# Patient Record
Sex: Female | Born: 1995
Health system: Southern US, Community
[De-identification: ages and names within clinical notes are randomized; demographics above are authoritative.]

## PROBLEM LIST (undated history)

## (undated) DIAGNOSIS — A6000 Herpesviral infection of urogenital system, unspecified: Secondary | ICD-10-CM

## (undated) DIAGNOSIS — G8929 Other chronic pain: Secondary | ICD-10-CM

---
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
## 2014-01-10 ENCOUNTER — Encounter (HOSPITAL_BASED_OUTPATIENT_CLINIC_OR_DEPARTMENT_OTHER): Payer: Self-pay | Admitting: Emergency Medicine

## 2014-01-10 ENCOUNTER — Emergency Department (HOSPITAL_BASED_OUTPATIENT_CLINIC_OR_DEPARTMENT_OTHER)
Admission: EM | Admit: 2014-01-10 | Discharge: 2014-01-10 | Disposition: A | Discharge status: Home / Self Care | Payer: Medicaid Other | Attending: Emergency Medicine | Admitting: Emergency Medicine

## 2014-01-10 DIAGNOSIS — IMO0002 Reserved for concepts with insufficient information to code with codable children: Secondary | ICD-10-CM | POA: Insufficient documentation

## 2014-01-10 DIAGNOSIS — N760 Acute vaginitis: Secondary | ICD-10-CM | POA: Insufficient documentation

## 2014-01-10 DIAGNOSIS — Z3202 Encounter for pregnancy test, result negative: Secondary | ICD-10-CM | POA: Insufficient documentation

## 2014-01-10 DIAGNOSIS — B9689 Other specified bacterial agents as the cause of diseases classified elsewhere: Secondary | ICD-10-CM | POA: Insufficient documentation

## 2014-01-10 DIAGNOSIS — Z79899 Other long term (current) drug therapy: Secondary | ICD-10-CM | POA: Insufficient documentation

## 2014-01-10 DIAGNOSIS — N949 Unspecified condition associated with female genital organs and menstrual cycle: Secondary | ICD-10-CM | POA: Insufficient documentation

## 2014-01-10 DIAGNOSIS — A499 Bacterial infection, unspecified: Secondary | ICD-10-CM | POA: Insufficient documentation

## 2014-01-10 DIAGNOSIS — Z880 Allergy status to penicillin: Secondary | ICD-10-CM | POA: Insufficient documentation

## 2014-01-10 DIAGNOSIS — A6000 Herpesviral infection of urogenital system, unspecified: Secondary | ICD-10-CM | POA: Insufficient documentation

## 2014-01-10 HISTORY — DX: Herpesviral infection of urogenital system, unspecified: A60.00

## 2014-01-10 LAB — URINALYSIS, ROUTINE W REFLEX MICROSCOPIC
Bilirubin Urine: NEGATIVE
Glucose, UA: NEGATIVE mg/dL
Hgb urine dipstick: NEGATIVE
Ketones, ur: 15 mg/dL — AB
Nitrite: NEGATIVE
Protein, ur: 30 mg/dL — AB
Specific Gravity, Urine: 1.029 (ref 1.005–1.030)
Urobilinogen, UA: 1 mg/dL (ref 0.0–1.0)
pH: 6 (ref 5.0–8.0)

## 2014-01-10 LAB — URINE MICROSCOPIC-ADD ON

## 2014-01-10 LAB — PREGNANCY, URINE: PREG TEST UR: NEGATIVE

## 2014-01-10 LAB — WET PREP, GENITAL
Trich, Wet Prep: NONE SEEN
Yeast Wet Prep HPF POC: NONE SEEN

## 2014-01-10 MED ORDER — HYDROCODONE-ACETAMINOPHEN 5-325 MG PO TABS: 1.0000 | ORAL_TABLET | ORAL | Status: DC | PRN

## 2014-01-10 MED ORDER — VALACYCLOVIR HCL 500 MG PO TABS: 500.0000 mg | ORAL_TABLET | Freq: Two times a day (BID) | ORAL | Status: AC

## 2014-01-10 MED ORDER — METRONIDAZOLE 500 MG PO TABS
500.0000 mg | ORAL_TABLET | Freq: Two times a day (BID) | ORAL | Status: DC
Start: 2014-01-10 — End: 2015-05-25

## 2014-01-10 NOTE — ED Provider Notes (Signed)
CSN: [id number]     Arrival date & time 01/10/14  1140 History   First MD Initiated Contact with Patient 01/10/14 1203     Chief Complaint  Patient presents with  . Vaginal Pain     (Consider location/radiation/quality/duration/timing/severity/associated sxs/prior Treatment) HPI Comments: Patient is a 18 year old female with a past medical history genital herpes who presents to the emergency department complaining of a herpes outbreak x2 days. Patient reports she has vaginal intercourse with a latex condom and believes that this caused the outbreak. States this feels similar to her prior outbreaks. She is experiencing vaginal pain that is severe in nature. Her last outbreak was a few months ago. She is sexually active with one partner and uses protection each time. She is on birth control pills. Denies vaginal discharge or bleeding. Denies abdominal pain, nausea, vomiting or fevers. No urinary symptoms.  Patient is a 18 y.o. female presenting with vaginal pain. The history is provided by the patient.  Vaginal Pain    Past Medical History  Diagnosis Date  . Genital herpes    History reviewed. No pertinent past surgical history. No family history on file. History  Substance Use Topics  . Smoking status: Never Smoker   . Smokeless tobacco: Not on file  . Alcohol Use: Yes   OB History   Grav Para Term Preterm Abortions TAB SAB Ect Mult Living                 Review of Systems  Genitourinary: Positive for vaginal pain.  All other systems reviewed and are negative.     Allergies  Amoxicillin; Penicillins; and Zithromax  Home Medications   Prior to Admission medications   Medication Sig Start Date End Date Taking? Authorizing Provider  cetirizine (ZYRTEC) 10 MG tablet Take 10 mg by mouth daily.    Historical Provider, MD  cetirizine-pseudoephedrine (ZYRTEC-D) 5-120 MG per tablet Take 1 tablet by mouth daily. 11/24/13   Elson Areas, PA-C  HYDROcodone-acetaminophen (NORCO)  5-325 MG per tablet Take 1 tablet by mouth every 6 (six) hours as needed. 07/24/13   April K Palumbo-Rasch, MD  HYDROcodone-acetaminophen (NORCO/VICODIN) 5-325 MG per tablet Take 1-2 tablets by mouth every 4 (four) hours as needed. 01/10/14   Trevor Mace, PA-C  levonorgestrel-ethinyl estradiol (SEASONALE,INTROVALE,JOLESSA) 0.15-0.03 MG tablet Take 1 tablet by mouth daily.    Historical Provider, MD  metroNIDAZOLE (FLAGYL) 500 MG tablet Take 1 tablet (500 mg total) by mouth 2 (two) times daily. One po bid x 7 days 01/10/14   Trevor Mace, PA-C  predniSONE (DELTASONE) 10 MG tablet 4,0,9,8,1,1 taper 11/24/13   Elson Areas, PA-C  triamcinolone (NASACORT AQ) 55 MCG/ACT AERO nasal inhaler Place 2 sprays into the nose daily. 03/28/13   Shari A Upstill, PA-C  valACYclovir (VALTREX) 500 MG tablet Take 1 tablet (500 mg total) by mouth 2 (two) times daily. 01/10/14 01/24/14  Trevor Mace, PA-C   BP 120/74  Pulse 75  Temp(Src) 98.9 F (37.2 C) (Oral)  Resp 20  Ht  (1.575 m)  Wt 135 lb (61.236 kg)  BMI 24.69 kg/m2  SpO2 100%  LMP 01/01/2014 Physical Exam  Nursing note and vitals reviewed. Constitutional: She is oriented to person, place, and time. She appears well-developed and well-nourished. No distress.  HENT:  Head: Normocephalic and atraumatic.  Mouth/Throat: Oropharynx is clear and moist.  Eyes: Conjunctivae are normal.  Neck: Normal range of motion. Neck supple.  Cardiovascular: Normal rate, regular rhythm and  normal heart sounds.   Pulmonary/Chest: Effort normal and breath sounds normal.  Abdominal: Soft. Bowel sounds are normal. There is no tenderness.  Genitourinary: Right adnexum displays no mass, no tenderness and no fullness. Left adnexum displays no mass, no tenderness and no fullness.  Herpetic appearing lesions medial to bilateral labia minora. Copious amount of thick, white, malodorous vaginal discharge. No cervical discharge or cervical motion tenderness.   Musculoskeletal: Normal range of motion. She exhibits no edema.  Neurological: She is alert and oriented to person, place, and time.  Skin: Skin is warm and dry. She is not diaphoretic.  Psychiatric: She has a normal mood and affect. Her behavior is normal.    ED Course  Procedures (including critical care time) Labs Review Labs Reviewed  WET PREP, GENITAL - Abnormal; Notable for the following:    Clue Cells Wet Prep HPF POC MANY (*)    WBC, Wet Prep HPF POC MODERATE (*)    All other components within normal limits  URINALYSIS, ROUTINE W REFLEX MICROSCOPIC - Abnormal; Notable for the following:    Color, Urine AMBER (*)    APPearance TURBID (*)    Ketones, ur 15 (*)    Protein, ur 30 (*)    Leukocytes, UA MODERATE (*)    All other components within normal limits  URINE MICROSCOPIC-ADD ON - Abnormal; Notable for the following:    Squamous Epithelial / LPF MANY (*)    Bacteria, UA MANY (*)    All other components within normal limits  GC/CHLAMYDIA PROBE AMP  PREGNANCY, URINE    Imaging Review No results found.   EKG Interpretation None      MDM   Final diagnoses:  BV (bacterial vaginosis)  Genital herpes   Patient was in the vaginal pain. She is nontoxic-appearing in no apparent distress. Afebrile, vital signs stable. Herpetic lesions noted on exam along with a large amount of vaginal discharge. No cervical motion tenderness or adnexal tenderness. Wet prep positive for many clue cells. GC/Chlamydia cultures pending. Patient is not concerned about treatment for STDs at this time. Will discharge with Flagyl, Valtrex and pain medication. Infection care precautions discussed. Return precautions given. Patient states understanding of treatment care plan and is agreeable.   Trevor Mace, PA-C 01/10/14 1321

## 2014-01-10 NOTE — ED Provider Notes (Signed)
Medical screening examination/treatment/procedure(s) were performed by non-physician practitioner and as supervising physician I was immediately available for consultation/collaboration.   EKG Interpretation None        Moussa Wiegand H Havah Ammon, MD 01/10/14 1857 

## 2014-01-10 NOTE — ED Notes (Signed)
Herpes outbreak. Vaginal pain x 2 days.

## 2014-01-10 NOTE — Discharge Instructions (Signed)
Take valtrex twice daily x 3 days. Take flagyl as prescribed. Take Vicodin for severe pain only. No driving or operating heavy machinery while taking vicodin. This medication may cause drowsiness.  Genital Herpes Genital herpes is a sexually transmitted disease. This means that it is a disease passed by having sex with an infected person. There is no cure for genital herpes. The time between attacks can be months to years. The virus may live in a person but produce no problems (symptoms). This infection can be passed to a baby as it travels down the birth canal (vagina). In a newborn, this can cause central nervous system damage, eye damage, or even death. The virus that causes genital herpes is usually HSV-2 virus. The virus that causes oral herpes is usually HSV-1. The diagnosis (learning what is wrong) is made through culture results. SYMPTOMS  Usually symptoms of pain and itching begin a few days to a week after contact. It first appears as small blisters that progress to small painful ulcers which then scab over and heal after several days. It affects the outer genitalia, birth canal, cervix, penis, anal area, buttocks, and thighs. HOME CARE INSTRUCTIONS   Keep ulcerated areas dry and clean.  Take medications as directed. Antiviral medications can speed up healing. They will not prevent recurrences or cure this infection. These medications can also be taken for suppression if there are frequent recurrences.  While the infection is active, it is contagious. Avoid all sexual contact during active infections.  Condoms may help prevent spread of the herpes virus.  Practice safe sex.  Wash your hands thoroughly after touching the genital area.  Avoid touching your eyes after touching your genital area.  Inform your caregiver if you have had genital herpes and become pregnant. It is your responsibility to insure a safe outcome for your baby in this pregnancy.  Only take over-the-counter or  prescription medicines for pain, discomfort, or fever as directed by your caregiver. SEEK MEDICAL CARE IF:   You have a recurrence of this infection.  You do not respond to medications and are not improving.  You have new sources of pain or discharge which have changed from the original infection.  You have an oral temperature above 102 F (38.9 C).  You develop abdominal pain.  You develop eye pain or signs of eye infection. Document Released: 04/26/2000 Document Revised: 07/22/2011 Document Reviewed: 05/17/2009 [facility] Patient Information 2015 [place], [place]. This information is not intended to replace advice given to you by your health care provider. Make sure you discuss any questions you have with your health care provider.  Bacterial Vaginosis Bacterial vaginosis is a vaginal infection that occurs when the normal balance of bacteria in the vagina is disrupted. It results from an overgrowth of certain bacteria. This is the most common vaginal infection in women of childbearing age. Treatment is important to prevent complications, especially in pregnant women, as it can cause a premature delivery. CAUSES  Bacterial vaginosis is caused by an increase in harmful bacteria that are normally present in smaller amounts in the vagina. Several different kinds of bacteria can cause bacterial vaginosis. However, the reason that the condition develops is not fully understood. RISK FACTORS Certain activities or behaviors can put you at an increased risk of developing bacterial vaginosis, including:  Having a new sex partner or multiple sex partners.  Douching.  Using an intrauterine device (IUD) for contraception. Women do not get bacterial vaginosis from toilet seats, bedding, swimming pools, or contact  with objects around them. SIGNS AND SYMPTOMS  Some women with bacterial vaginosis have no signs or symptoms. Common symptoms include:  Grey vaginal discharge.  A fishlike odor with  discharge, especially after sexual intercourse.  Itching or burning of the vagina and vulva.  Burning or pain with urination. DIAGNOSIS  Your health care provider will take a medical history and examine the vagina for signs of bacterial vaginosis. A sample of vaginal fluid may be taken. Your health care provider will look at this sample under a microscope to check for bacteria and abnormal cells. A vaginal pH test may also be done.  TREATMENT  Bacterial vaginosis may be treated with antibiotic medicines. These may be given in the form of a pill or a vaginal cream. A second round of antibiotics may be prescribed if the condition comes back after treatment.  HOME CARE INSTRUCTIONS   Only take over-the-counter or prescription medicines as directed by your health care provider.  If antibiotic medicine was prescribed, take it as directed. Make sure you finish it even if you start to feel better.  Do not have sex until treatment is completed.  Tell all sexual partners that you have a vaginal infection. They should see their health care provider and be treated if they have problems, such as a mild rash or itching.  Practice safe sex by using condoms and only having one sex partner. SEEK MEDICAL CARE IF:   Your symptoms are not improving after 3 days of treatment.  You have increased discharge or pain.  You have a fever. MAKE SURE YOU:   Understand these instructions.  Will watch your condition.  Will get help right away if you are not doing well or get worse. FOR MORE INFORMATION  Centers for Disease Control and Prevention, Division of STD Prevention: [employer] American Sexual Health Association (ASHA): www.ashastd.org  Document Released: 04/29/2005 Document Revised: 02/17/2013 Document Reviewed: 12/09/2012 [facility] Patient Information 2015 [place], [place]. This information is not intended to replace advice given to you by your health care provider. Make sure you discuss any  questions you have with your health care provider.

## 2014-01-11 LAB — GC/CHLAMYDIA PROBE AMP
CT Probe RNA: NEGATIVE
GC PROBE AMP APTIMA: NEGATIVE

## 2015-05-25 ENCOUNTER — Emergency Department (HOSPITAL_BASED_OUTPATIENT_CLINIC_OR_DEPARTMENT_OTHER): Payer: Medicaid Other

## 2015-05-25 ENCOUNTER — Encounter (HOSPITAL_BASED_OUTPATIENT_CLINIC_OR_DEPARTMENT_OTHER): Payer: Self-pay | Admitting: Emergency Medicine

## 2015-05-25 ENCOUNTER — Emergency Department (HOSPITAL_BASED_OUTPATIENT_CLINIC_OR_DEPARTMENT_OTHER)
Admission: EM | Admit: 2015-05-25 | Discharge: 2015-05-25 | Disposition: A | Discharge status: Home / Self Care | Payer: Medicaid Other | Attending: Emergency Medicine | Admitting: Emergency Medicine

## 2015-05-25 DIAGNOSIS — R109 Unspecified abdominal pain: Secondary | ICD-10-CM

## 2015-05-25 DIAGNOSIS — R197 Diarrhea, unspecified: Secondary | ICD-10-CM | POA: Diagnosis not present

## 2015-05-25 DIAGNOSIS — R630 Anorexia: Secondary | ICD-10-CM | POA: Insufficient documentation

## 2015-05-25 DIAGNOSIS — Z79899 Other long term (current) drug therapy: Secondary | ICD-10-CM | POA: Insufficient documentation

## 2015-05-25 DIAGNOSIS — Z3202 Encounter for pregnancy test, result negative: Secondary | ICD-10-CM | POA: Insufficient documentation

## 2015-05-25 DIAGNOSIS — R1031 Right lower quadrant pain: Secondary | ICD-10-CM | POA: Insufficient documentation

## 2015-05-25 DIAGNOSIS — Z880 Allergy status to penicillin: Secondary | ICD-10-CM | POA: Diagnosis not present

## 2015-05-25 DIAGNOSIS — R110 Nausea: Secondary | ICD-10-CM | POA: Insufficient documentation

## 2015-05-25 LAB — URINALYSIS, ROUTINE W REFLEX MICROSCOPIC
Bilirubin Urine: NEGATIVE
Glucose, UA: NEGATIVE mg/dL
Hgb urine dipstick: NEGATIVE
Ketones, ur: NEGATIVE mg/dL
NITRITE: NEGATIVE
PROTEIN: NEGATIVE mg/dL
SPECIFIC GRAVITY, URINE: 1.024 (ref 1.005–1.030)
pH: 6.5 (ref 5.0–8.0)

## 2015-05-25 LAB — CBC WITH DIFFERENTIAL/PLATELET
BASOS PCT: 0 %
Basophils Absolute: 0 10*3/uL (ref 0.0–0.1)
Eosinophils Absolute: 0.1 10*3/uL (ref 0.0–0.7)
Eosinophils Relative: 4 %
HEMATOCRIT: 38.2 % (ref 36.0–46.0)
Hemoglobin: 12.7 g/dL (ref 12.0–15.0)
LYMPHS PCT: 50 %
Lymphs Abs: 1.7 10*3/uL (ref 0.7–4.0)
MCH: 25.3 pg — ABNORMAL LOW (ref 26.0–34.0)
MCHC: 33.2 g/dL (ref 30.0–36.0)
MCV: 76.1 fL — AB (ref 78.0–100.0)
Monocytes Absolute: 0.3 10*3/uL (ref 0.1–1.0)
Monocytes Relative: 9 %
Neutro Abs: 1.3 10*3/uL — ABNORMAL LOW (ref 1.7–7.7)
Neutrophils Relative %: 37 %
Platelets: 371 10*3/uL (ref 150–400)
RBC: 5.02 MIL/uL (ref 3.87–5.11)
RDW: 14.1 % (ref 11.5–15.5)
WBC: 3.5 10*3/uL — AB (ref 4.0–10.5)

## 2015-05-25 LAB — URINE MICROSCOPIC-ADD ON

## 2015-05-25 LAB — BASIC METABOLIC PANEL
Anion gap: 8 (ref 5–15)
BUN: 11 mg/dL (ref 6–20)
CALCIUM: 9.4 mg/dL (ref 8.9–10.3)
CO2: 27 mmol/L (ref 22–32)
CREATININE: 0.82 mg/dL (ref 0.44–1.00)
Chloride: 106 mmol/L (ref 101–111)
GFR calc non Af Amer: >60 mL/min (ref >60)
GLUCOSE: 94 mg/dL (ref 65–99)
Potassium: 3.9 mmol/L (ref 3.5–5.1)
Sodium: 141 mmol/L (ref 135–145)

## 2015-05-25 LAB — PREGNANCY, URINE: Preg Test, Ur: NEGATIVE

## 2015-05-25 MED ORDER — ONDANSETRON HCL 4 MG/2ML IJ SOLN
4.0000 mg | Freq: Once | INTRAMUSCULAR | Status: AC
  Administered 2015-05-25: 4 mg via INTRAVENOUS
  Filled 2015-05-25: qty 2

## 2015-05-25 MED ORDER — IOHEXOL 300 MG/ML  SOLN
100.0000 mL | Freq: Once | INTRAMUSCULAR | Status: AC | PRN
  Administered 2015-05-25: 100 mL via INTRAVENOUS

## 2015-05-25 MED ORDER — KETOROLAC TROMETHAMINE 30 MG/ML IJ SOLN
30.0000 mg | Freq: Once | INTRAMUSCULAR | Status: AC
  Administered 2015-05-25: 30 mg via INTRAVENOUS
  Filled 2015-05-25: qty 1

## 2015-05-25 MED ORDER — NAPROXEN 500 MG PO TABS: 500.0000 mg | ORAL_TABLET | Freq: Two times a day (BID) | ORAL | Status: DC

## 2015-05-25 MED ORDER — IOHEXOL 300 MG/ML  SOLN
25.0000 mL | Freq: Once | INTRAMUSCULAR | Status: AC | PRN
  Administered 2015-05-25: 25 mL via ORAL

## 2015-05-25 NOTE — ED Notes (Signed)
Patient is alert and oriented x3.  She was given DC instructions and follow up visit instructions.  Patient gave verbal understanding. She was DC ambulatory under her own power to home.  V/S stable.  He was not showing any signs of distress on DC 

## 2015-05-25 NOTE — ED Provider Notes (Signed)
CSN: [id number]     Arrival date & time 05/25/15  0831 History   First MD Initiated Contact with Patient 05/25/15 [phone]     Chief Complaint  Patient presents with  . Abdominal Pain      HPI  Patient presents for evaluation of abdominal pain. Onset last night 11 PM.  She was in her normal state of health. She began feeling some cramping pain in her lower abdomen. Perhaps right greater than left but she does not recall. Her last missed her period was in December 17. She is a week early. States her symptoms do not resemble menstrual symptoms. Although I thought maybe that was last night". She takes Vicoprofen and went to bed. She is not having more diarrhea. She waking this morning at 6 AM. She was awakened by the pain. Hurts to move. It is no longer cramping is constant and more onto the right side. Nausea no vomiting. Does not have an appetite currently. No fevers or chills. No dysuria. No discharge. No vaginal bleeding.  Status post C-section September 2015.  Past Medical History  Diagnosis Date  . Genital herpes    Past Surgical History  Procedure Laterality Date  . Cesarean section     No family history on file. Social History  Substance Use Topics  . Smoking status: Never Smoker   . Smokeless tobacco: None  . Alcohol Use: Yes   OB History    No data available     Review of Systems  Constitutional: Positive for appetite change. Negative for fever, chills, diaphoresis and fatigue.  HENT: Negative for mouth sores, sore throat and trouble swallowing.   Eyes: Negative for visual disturbance.  Respiratory: Negative for cough, chest tightness, shortness of breath and wheezing.   Cardiovascular: Negative for chest pain.  Gastrointestinal: Positive for nausea, abdominal pain and diarrhea. Negative for vomiting and abdominal distention.  Endocrine: Negative for polydipsia, polyphagia and polyuria.  Genitourinary: Negative for dysuria, frequency and hematuria.  Musculoskeletal:  Negative for gait problem.  Skin: Negative for color change, pallor and rash.  Neurological: Negative for dizziness, syncope, light-headedness and headaches.  Hematological: Does not bruise/bleed easily.  Psychiatric/Behavioral: Negative for behavioral problems and confusion.      Allergies  Amoxicillin; Penicillins; and Zithromax  Home Medications   Prior to Admission medications   Medication Sig Start Date End Date Taking? Authorizing Provider  cetirizine (ZYRTEC) 10 MG tablet Take 10 mg by mouth daily.    Historical Provider, MD  naproxen (NAPROSYN) 500 MG tablet Take 1 tablet (500 mg total) by mouth 2 (two) times daily. 05/25/15   Rolland PorterMark Karen Huhta, MD   BP 116/77 mmHg  Pulse 68  Temp(Src) 98.2 F (36.8 C) (Oral)  Resp 16  Ht 5\' 2"  (1.575 m)  Wt 147 lb (66.679 kg)  BMI 26.88 kg/m2  SpO2 97%  LMP 04/29/2015 Physical Exam  Constitutional: She is oriented to person, place, and time. She appears well-developed and well-nourished. No distress.  HENT:  Head: Normocephalic.  Eyes: Conjunctivae are normal. Pupils are equal, round, and reactive to light. No scleral icterus.  Neck: Normal range of motion. Neck supple. No thyromegaly present.  Cardiovascular: Normal rate and regular rhythm.  Exam reveals no gallop and no friction rub.   No murmur heard. Pulmonary/Chest: Effort normal and breath sounds normal. No respiratory distress. She has no wheezes. She has no rales.  Abdominal: Soft. Bowel sounds are normal. She exhibits no distension. There is no tenderness. There is no rebound.  Slight tenderness in the right lower quadrant. Pain in the left abdomen inconsistently produces discomfort in the right abdomen. She describes discomfort with walking but has a normal gait. She describes discomfort with movement of the right lower extremity but has a negative psoas.  Musculoskeletal: Normal range of motion.  Neurological: She is alert and oriented to person, place, and time.  Skin: Skin  is warm and dry. No rash noted.  Psychiatric: She has a normal mood and affect. Her behavior is normal.    ED Course  Procedures (including critical care time) Labs Review Labs Reviewed  URINALYSIS, ROUTINE W REFLEX MICROSCOPIC (NOT AT [facility]) - Abnormal; Notable for the following:    APPearance CLOUDY (*)    Leukocytes, UA SMALL (*)    All other components within normal limits  CBC WITH DIFFERENTIAL/PLATELET - Abnormal; Notable for the following:    WBC 3.5 (*)    MCV 76.1 (*)    MCH 25.3 (*)    Neutro Abs 1.3 (*)    All other components within normal limits  URINE MICROSCOPIC-ADD ON - Abnormal; Notable for the following:    Squamous Epithelial / LPF 0-5 (*)    Bacteria, UA MANY (*)    All other components within normal limits  PREGNANCY, URINE  BASIC METABOLIC PANEL    Imaging Review Ct Abdomen Pelvis W Contrast  05/25/2015  CLINICAL DATA:  Right lower quadrant pain with nausea for 1 day EXAM: CT ABDOMEN AND PELVIS WITH CONTRAST TECHNIQUE: Multidetector CT imaging of the abdomen and pelvis was performed using the standard protocol following bolus administration of intravenous contrast. Oral contrast was also administered. CONTRAST:  OMNIPAQUE IOHEXOL 300 MG/ML SOLN, 25mL OMNIPAQUE IOHEXOL 300 MG/ML SOLN COMPARISON:  June 17, 2008 FINDINGS: Lower chest:  Lung bases are clear. Hepatobiliary: There is fatty infiltration near the fissure for the ligamentum teres. No focal liver lesions are identified. The gallbladder wall is not appreciably thickened. There is no biliary duct dilatation. Pancreas: No mass or inflammatory focus. Spleen: No splenic lesions are identified. Adrenals/Urinary Tract: Adrenals appear normal bilaterally. Kidneys bilaterally show no mass or hydronephrosis on either side. There is no renal or ureteral calculus on either side. Urinary bladder is midline with wall thickness within normal limits. Stomach/Bowel: There is no bowel wall or mesenteric thickening. No  bowel obstruction. No free air or portal venous air. Vascular/Lymphatic: There is no abdominal aortic aneurysm. Major mesenteric vessels appear patent. No adenopathy is appreciable in the abdomen or pelvis. Reproductive: Uterus is anteverted. There is an intrauterine device within the uterus. There is no pelvic mass. There is a small amount of free fluid in the dependent portion of the pelvis. Other: Appendix appears normal. No abscess in the abdomen or pelvis. Musculoskeletal: There are no blastic or lytic bone lesions. No intramuscular or abdominal wall lesions. IMPRESSION: Small amount of free fluid in the dependent portion the pelvis. This amount of fluid is upper physiologic versus sequela of recent ovarian cyst rupture. Appendix region appears normal. No bowel obstruction. No abscess. No renal or ureteral calculus. No hydronephrosis. Intrauterine device within the endometrium. Fatty infiltration near the fissure for the ligamentum teres. Electronically Signed   By: Bretta Bang III M.D.   On: 05/25/2015 10:25   I have personally reviewed and evaluated these images and lab results as part of my medical decision-making.   EKG Interpretation None      MDM   Final diagnoses:  Abdominal pain, unspecified abdominal location  He was improved with Toradol. Taking by mouth. CT scan shows trace fluid right pelvis. No adenopathy. No appendicitis is discussed these findings with her at length. We discussed pelvic exam. She inquired about the indications. We discussed sexual transmitted disease, ovarian cyst, PID. She politely declines. I think this is not unreasonable. Plan be treatment with anti-inflammatories. With him white blood cell count slightly low this may be viral. Ruptured ovarian cyst is in the differential diagnosis. Not sudden onset or severe pain to suggest ovarian torsion. Appropriate for discharge. Return precautions discussed.   Rolland Porter, MD 05/25/15 1115

## 2015-05-25 NOTE — ED Notes (Signed)
Drinking contrast.

## 2015-05-25 NOTE — ED Notes (Signed)
Onset of abd pain with diarrhea last pm   Pain worse this am

## 2015-05-25 NOTE — Discharge Instructions (Signed)
Return to ER with any new or worsening symptoms including vaginal discharge, worsening pain, fever, bloody stools, or other new or changing your current symptoms  Abdominal Pain, Adult Many things can cause abdominal pain. Usually, abdominal pain is not caused by a disease and will improve without treatment. It can often be observed and treated at home. Your health care provider will do a physical exam and possibly order blood tests and X-rays to help determine the seriousness of your pain. However, in many cases, more time must pass before a clear cause of the pain can be found. Before that point, your health care provider may not know if you need more testing or further treatment. HOME CARE INSTRUCTIONS Monitor your abdominal pain for any changes. The following actions may help to alleviate any discomfort you are experiencing:  Only take over-the-counter or prescription medicines as directed by your health care provider.  Do not take laxatives unless directed to do so by your health care provider.  Try a clear liquid diet (broth, tea, or water) as directed by your health care provider. Slowly move to a bland diet as tolerated. SEEK MEDICAL CARE IF:  You have unexplained abdominal pain.  You have abdominal pain associated with nausea or diarrhea.  You have pain when you urinate or have a bowel movement.  You experience abdominal pain that wakes you in the night.  You have abdominal pain that is worsened or improved by eating food.  You have abdominal pain that is worsened with eating fatty foods.  You have a fever. SEEK IMMEDIATE MEDICAL CARE IF:  Your pain does not go away within 2 hours.  You keep throwing up (vomiting).  Your pain is felt only in portions of the abdomen, such as the right side or the left lower portion of the abdomen.  You pass bloody or black tarry stools. MAKE SURE YOU:  Understand these instructions.  Will watch your condition.  Will get help right  away if you are not doing well or get worse.   This information is not intended to replace advice given to you by your health care provider. Make sure you discuss any questions you have with your health care provider.   Document Released: 02/06/2005 Document Revised: 01/18/2015 Document Reviewed: 01/06/2013 Elsevier Interactive Patient Education [employer]2016 Elsevier Inc.

## 2015-05-25 NOTE — ED Notes (Signed)
MD at bedside. 

## 2018-10-02 ENCOUNTER — Other Ambulatory Visit: Payer: Self-pay

## 2018-10-02 ENCOUNTER — Encounter (HOSPITAL_BASED_OUTPATIENT_CLINIC_OR_DEPARTMENT_OTHER): Payer: Self-pay | Admitting: Emergency Medicine

## 2018-10-02 ENCOUNTER — Emergency Department (HOSPITAL_BASED_OUTPATIENT_CLINIC_OR_DEPARTMENT_OTHER)
Admission: EM | Admit: 2018-10-02 | Discharge: 2018-10-02 | Disposition: A | Discharge status: Home / Self Care | Payer: Self-pay | Attending: Emergency Medicine | Admitting: Emergency Medicine

## 2018-10-02 DIAGNOSIS — T378X5A Adverse effect of other specified systemic anti-infectives and antiparasitics, initial encounter: Secondary | ICD-10-CM | POA: Insufficient documentation

## 2018-10-02 DIAGNOSIS — L299 Pruritus, unspecified: Secondary | ICD-10-CM | POA: Insufficient documentation

## 2018-10-02 DIAGNOSIS — R112 Nausea with vomiting, unspecified: Secondary | ICD-10-CM

## 2018-10-02 DIAGNOSIS — T3695XA Adverse effect of unspecified systemic antibiotic, initial encounter: Secondary | ICD-10-CM

## 2018-10-02 LAB — CBC WITH DIFFERENTIAL/PLATELET
Abs Immature Granulocytes: 0.02 10*3/uL (ref 0.00–0.07)
Basophils Absolute: 0 10*3/uL (ref 0.0–0.1)
Basophils Relative: 0 %
Eosinophils Absolute: 0.1 10*3/uL (ref 0.0–0.5)
Eosinophils Relative: 1 %
HCT: 48.9 % — ABNORMAL HIGH (ref 36.0–46.0)
Hemoglobin: 16.5 g/dL — ABNORMAL HIGH (ref 12.0–15.0)
Immature Granulocytes: 0 %
Lymphocytes Relative: 17 %
Lymphs Abs: 1.4 10*3/uL (ref 0.7–4.0)
MCH: 29.5 pg (ref 26.0–34.0)
MCHC: 33.7 g/dL (ref 30.0–36.0)
MCV: 87.3 fL (ref 80.0–100.0)
Monocytes Absolute: 0.4 10*3/uL (ref 0.1–1.0)
Monocytes Relative: 5 %
Neutro Abs: 6.6 10*3/uL (ref 1.7–7.7)
Neutrophils Relative %: 77 %
Platelets: 611 10*3/uL — ABNORMAL HIGH (ref 150–400)
RBC: 5.6 MIL/uL — ABNORMAL HIGH (ref 3.87–5.11)
RDW: 11.8 % (ref 11.5–15.5)
WBC: 8.5 10*3/uL (ref 4.0–10.5)
nRBC: 0 % (ref 0.0–0.2)

## 2018-10-02 LAB — BASIC METABOLIC PANEL
Anion gap: 12 (ref 5–15)
BUN: 22 mg/dL — ABNORMAL HIGH (ref 6–20)
CO2: 28 mmol/L (ref 22–32)
Calcium: 9.5 mg/dL (ref 8.9–10.3)
Chloride: 95 mmol/L — ABNORMAL LOW (ref 98–111)
Creatinine, Ser: 1.04 mg/dL — ABNORMAL HIGH (ref 0.44–1.00)
GFR calc Af Amer: >60 mL/min (ref >60)
GFR calc non Af Amer: >60 mL/min (ref >60)
Glucose, Bld: 163 mg/dL — ABNORMAL HIGH (ref 70–99)
Potassium: 3.7 mmol/L (ref 3.5–5.1)
Sodium: 135 mmol/L (ref 135–145)

## 2018-10-02 LAB — HEPATIC FUNCTION PANEL
ALT: 20 U/L (ref 0–44)
AST: 25 U/L (ref 15–41)
Albumin: 4.5 g/dL (ref 3.5–5.0)
Alkaline Phosphatase: 49 U/L (ref 38–126)
Bilirubin, Direct: 0.1 mg/dL (ref 0.0–0.2)
Indirect Bilirubin: 1 mg/dL — ABNORMAL HIGH (ref 0.3–0.9)
Total Bilirubin: 1.1 mg/dL (ref 0.3–1.2)
Total Protein: 7.9 g/dL (ref 6.5–8.1)

## 2018-10-02 LAB — LIPASE, BLOOD: Lipase: 20 U/L (ref 11–51)

## 2018-10-02 MED ORDER — DEXAMETHASONE SODIUM PHOSPHATE 10 MG/ML IJ SOLN
10.0000 mg | Freq: Once | INTRAMUSCULAR | Status: AC
  Administered 2018-10-02: 08:00:00 10 mg via INTRAVENOUS
  Filled 2018-10-02: qty 1

## 2018-10-02 MED ORDER — DEXAMETHASONE 6 MG PO TABS
10.0000 mg | ORAL_TABLET | Freq: Once | ORAL | Status: DC
  Filled 2018-10-02: qty 1

## 2018-10-02 MED ORDER — DIPHENHYDRAMINE HCL 50 MG/ML IJ SOLN
25.0000 mg | Freq: Once | INTRAMUSCULAR | Status: AC
  Administered 2018-10-02: 07:00:00 25 mg via INTRAVENOUS

## 2018-10-02 MED ORDER — ONDANSETRON HCL 4 MG/2ML IJ SOLN
4.0000 mg | Freq: Once | INTRAMUSCULAR | Status: AC
  Administered 2018-10-02: 07:00:00 4 mg via INTRAVENOUS
  Filled 2018-10-02: qty 2

## 2018-10-02 MED ORDER — SODIUM CHLORIDE 0.9 % IV BOLUS
1000.0000 mL | Freq: Once | INTRAVENOUS | Status: AC
Start: 2018-10-02 — End: 2018-10-02
  Administered 2018-10-02: 1000 mL via INTRAVENOUS

## 2018-10-02 MED ORDER — ONDANSETRON 4 MG PO TBDP: 4.0000 mg | ORAL_TABLET | Freq: Three times a day (TID) | ORAL | 0 refills | Status: DC | PRN

## 2018-10-02 MED ORDER — DIPHENHYDRAMINE HCL 50 MG/ML IJ SOLN
INTRAMUSCULAR | Status: AC
  Administered 2018-10-02: 25 mg via INTRAVENOUS
  Filled 2018-10-02: qty 1

## 2018-10-02 NOTE — ED Notes (Signed)
Provided po fluids, states nausea slightly improved, resting with eyes closed

## 2018-10-02 NOTE — ED Triage Notes (Signed)
Pt states she has been vomiting and has red itching rash all over body. Pt has been taking smz/tmp ds ax for an infection on breast.

## 2018-10-02 NOTE — ED Notes (Signed)
Ambulated to bathroom with steady gait, reports loose stool, unable to provide separate urine specimen.

## 2018-10-02 NOTE — ED Provider Notes (Signed)
I received pt in signout from Dr. Preston Fleeting. She had presented w/ N/V and itching in setting of bactrim use. He was concerned about medication reaction and was treating for allergic reaction w/ IVF, benadryl, decadron. At time of signout, awaiting completion of labs and clinical improvement.  Labs show Cr 1.04, normal WBC count, normal LFTs and lipase. After medications, pt comfortable on reassessment, nausea improved, drinking water. Discussed supportive measures at home and extensively reviewed return precautions. Instructed to discontinue bactrim and avoid future use.   Justinian Miano, Ambrose Finland, MD 10/02/18 [phone]

## 2018-10-02 NOTE — Discharge Instructions (Signed)
Take benadryl 25mg  every 8 hours for the next 2-3 days. Take pepcid 20mg  daily for 3 days. Take zofran as prescribed, as needed for nausea. Stay hydrated. Stop taking bactrim (antibiotic) and avoid using it in the future.  Return to ER if any breathing problems, severe vomiting, fever, or other worsening symptoms.

## 2018-10-02 NOTE — ED Notes (Signed)
Pt continues to describe mild nausea and mid abdominal pain.  Aware of need for urine specimen when able.

## 2018-10-02 NOTE — ED Provider Notes (Signed)
[facility] EMERGENCY DEPARTMENT Provider Note   CSN: [id number] Arrival date & time: 10/02/18  8786    History   Chief Complaint Chief Complaint  Patient presents with  . Medication Reaction    HPI Kylie Williams is a 23 y.o. female.   The history is provided by the patient.  She has been taking trimethoprim-sulfamethoxazole since May 6 for an infection of the right breast.  2 days ago, she started having generalized itching, nausea, vomiting.  She stopped taking the antibiotic, but continues to have itching and vomiting.  She denies fever, chills, sweats.  Her abdomen is sore from vomiting but no true abdominal pain.  She has not taken anything to treat the symptoms.  Her breast has improved.  Past Medical History:  Diagnosis Date  . Genital herpes     There are no active problems to display for this patient.   Past Surgical History:  Procedure Laterality Date  . CESAREAN SECTION       OB History   No obstetric history on file.      Home Medications    Prior to Admission medications   Medication Sig Start Date End Date Taking? Authorizing Provider  sulfamethoxazole-trimethoprim (BACTRIM) 400-80 MG tablet Take 1 tablet by mouth 2 (two) times daily.   Yes [provider]    Family History No family history on file.  Social History Social History   Tobacco Use  . Smoking status: Never Smoker  Substance Use Topics  . Alcohol use: Yes  . Drug use: Yes    Types: Marijuana     Allergies   Amoxicillin; Penicillins; and Zithromax [azithromycin]   Review of Systems Review of Systems  All other systems reviewed and are negative.    Physical Exam Updated Vital Signs BP 120/82 (BP Location: Right Arm)   Pulse 93   Temp 98.2 F (36.8 C) (Oral)   Resp 18   Ht 5\' 3"  (1.6 m)   Wt 59 kg   SpO2 96%   BMI 23.03 kg/m   Physical Exam Vitals signs and nursing note reviewed.    23 year old female, resting comfortably and in no acute  distress. Vital signs are normal. Oxygen saturation is 96%, which is normal. Head is normocephalic and atraumatic. PERRLA, EOMI. Oropharynx is clear. Neck is nontender and supple without adenopathy or JVD. Back is nontender and there is no CVA tenderness. Lungs are clear without rales, wheezes, or rhonchi. Chest is nontender. Heart has regular rate and rhythm without murmur. Abdomen is soft, flat, nontender without masses or hepatosplenomegaly and peristalsis is hypoactive. Extremities have no cyanosis or edema, full range of motion is present. Skin is warm and dry.  Urticarial rash is present rather diffusely. Neurologic: Mental status is normal, cranial nerves are intact, there are no motor or sensory deficits.  ED Treatments / Results  Labs (all labs ordered are listed, but only abnormal results are displayed) Labs Reviewed  BASIC METABOLIC PANEL  CBC WITH DIFFERENTIAL/PLATELET  PREGNANCY, URINE   Procedures Procedures   Medications Ordered in ED Medications  diphenhydrAMINE (BENADRYL) 50 MG/ML injection (has no administration in time range)  sodium chloride 0.9 % bolus 1,000 mL (has no administration in time range)  ondansetron (ZOFRAN) injection 4 mg (has no administration in time range)  dexamethasone (DECADRON) tablet 10 mg (has no administration in time range)     Initial Impression / Assessment and Plan / ED Course  I have reviewed the triage vital signs  and the nursing notes.  Pertinent lab results that were available during my care of the patient were reviewed by me and considered in my medical decision making (see chart for details).  Rash and vomiting likely allergy to sulfa component of trimethoprim-sulfamethoxazole.  Old records are reviewed, and she has no relevant past visits.  She will be given IV fluids, diphenhydramine, ondansetron, dexamethasone.  Case is signed out to Dr. Clarene DukeLittle.  Final Clinical Impressions(s) / ED Diagnoses   Final diagnoses:   Non-intractable vomiting with nausea, unspecified vomiting type  Allergic reaction due to antibacterial drug    ED Discharge Orders    None       Dione BoozeGlick, Tarini Carrier, MD 10/02/18 [phone]

## 2018-10-20 ENCOUNTER — Other Ambulatory Visit (HOSPITAL_COMMUNITY): Payer: Self-pay | Admitting: Obstetrics and Gynecology

## 2018-10-20 ENCOUNTER — Other Ambulatory Visit (HOSPITAL_COMMUNITY): Payer: Self-pay | Admitting: *Deleted

## 2018-10-20 DIAGNOSIS — N611 Abscess of the breast and nipple: Secondary | ICD-10-CM

## 2018-10-22 ENCOUNTER — Encounter (HOSPITAL_COMMUNITY): Payer: Self-pay

## 2018-10-22 ENCOUNTER — Other Ambulatory Visit: Payer: Self-pay

## 2018-10-22 ENCOUNTER — Ambulatory Visit (HOSPITAL_COMMUNITY)
Admission: RE | Admit: 2018-10-22 | Discharge: 2018-10-22 | Disposition: A | Discharge status: Home / Self Care | Payer: Self-pay | Source: Ambulatory Visit | Attending: Obstetrics and Gynecology | Admitting: Obstetrics and Gynecology

## 2018-10-22 VITALS — BP 108/62 | Temp 98.8°F | Wt 134.0 lb

## 2018-10-22 DIAGNOSIS — N6310 Unspecified lump in the right breast, unspecified quadrant: Secondary | ICD-10-CM

## 2018-10-22 DIAGNOSIS — Z1239 Encounter for other screening for malignant neoplasm of breast: Secondary | ICD-10-CM

## 2018-10-22 NOTE — Progress Notes (Signed)
Patient referred to [facility] by the [facility] for a right breast abscess. Patient was given one antibiotic that thinks was Keflex that did not help and then [facility]. Patient stated she had an allergic reaction to the Bactrim and is not currently on an antibiotic.  Patient complaints of a right breast lump that started August 24, 2018. Patient stated it started draining a red fluid this past Sunday. Patient complained of right breast pain that comes and goes. Patient rated the pain at a 3 out of 10.  Pap Smear: Pap smear not completed today. Last Pap smear was in September 2018 at [facility] and normal per patient. Per patient has no history of an abnormal Pap smear. No Pap smear results are in Epic.  Physical exam: Breasts Breasts symmetrical. No skin abnormalities left breast. Right breast reddened between 7 o'clock and 11 o'clock next to nipple. Open area observed right breast around 9 o'clock that is draining a red pus appearing fluid. No nipple retraction bilateral breasts. No nipple discharge bilateral breasts. No lymphadenopathy. No lumps palpated left breast. Palpated a lump within the right breast between 9 o'clock and 11 o'clock. Complaints of pain when palpated lump within the right breast. Referred patient to the [facility] for a right breast ultrasound. Appointment scheduled for Friday, October 23, 2018 at 1300.        Pelvic/Bimanual No Pap smear completed today since last Pap smear was in September 2018 per patient. Pap smear not indicated per [facility] guidelines.   Smoking History: Patient has never smoked.  Patient Navigation: Patient education provided. Access to services provided for patient through [facility] program.   Breast and Cervical Cancer Risk Assessment: Patient has no family history of breast cancer, known genetic mutations, or radiation treatment to the chest before age 54. Patient has no history of cervical dysplasia,  immunocompromised, or DES exposure in-utero. Breast cancer risk assessment  Completed. No breast cancer risk calculated due to patient is less than 73 years old.

## 2018-10-22 NOTE — Patient Instructions (Signed)
Explained breast self awareness with Ave Filter. Patient did not need a Pap smear today due to last Pap smear was in September 2018 per patient. Let her know [facility] will cover Pap smears every 3 years unless has a history of abnormal Pap smears. Referred patient to the [place] for a right breast ultrasound. Appointment scheduled for Friday, October 23, 2018 at 1300. Patient aware of appointment and will be there. Called patient after [facility]CP appointment to give appointment time. [facility] verbalized understanding.  Layza Summa, Arvil Chaco, RN 11:38 AM

## 2018-10-23 ENCOUNTER — Ambulatory Visit
Admission: RE | Admit: 2018-10-23 | Discharge: 2018-10-23 | Disposition: A | Discharge status: Home / Self Care | Payer: No Typology Code available for payment source | Source: Ambulatory Visit | Attending: Obstetrics and Gynecology | Admitting: Obstetrics and Gynecology

## 2018-10-23 ENCOUNTER — Ambulatory Visit
Admission: RE | Admit: 2018-10-23 | Discharge: 2018-10-23 | Disposition: A | Discharge status: Home / Self Care | Payer: Self-pay | Source: Ambulatory Visit | Attending: Obstetrics and Gynecology | Admitting: Obstetrics and Gynecology

## 2018-10-23 DIAGNOSIS — N611 Abscess of the breast and nipple: Secondary | ICD-10-CM

## 2018-10-27 ENCOUNTER — Other Ambulatory Visit: Payer: Self-pay

## 2018-10-28 ENCOUNTER — Encounter (HOSPITAL_COMMUNITY): Payer: Self-pay | Admitting: *Deleted

## 2019-03-03 ENCOUNTER — Emergency Department (HOSPITAL_BASED_OUTPATIENT_CLINIC_OR_DEPARTMENT_OTHER)
Admission: EM | Admit: 2019-03-03 | Discharge: 2019-03-03 | Disposition: A | Discharge status: Home / Self Care | Payer: No Typology Code available for payment source | Attending: Emergency Medicine | Admitting: Emergency Medicine

## 2019-03-03 ENCOUNTER — Encounter (HOSPITAL_BASED_OUTPATIENT_CLINIC_OR_DEPARTMENT_OTHER): Payer: Self-pay | Admitting: Emergency Medicine

## 2019-03-03 ENCOUNTER — Other Ambulatory Visit: Payer: Self-pay

## 2019-03-03 DIAGNOSIS — N61 Mastitis without abscess: Secondary | ICD-10-CM | POA: Insufficient documentation

## 2019-03-03 MED ORDER — CEPHALEXIN 500 MG PO CAPS: 500.0000 mg | ORAL_CAPSULE | Freq: Four times a day (QID) | ORAL | 0 refills | Status: AC

## 2019-03-03 MED FILL — CEPHALEXIN 500 MG CAPSULE: 500 | 10 days supply | Qty: 40 | Fill #0

## 2019-03-03 NOTE — Discharge Instructions (Signed)
Recommend scheduling a follow-up appoint with your primary doctor for recheck and 2 to 3 days.  Recommend returning to ER if you have worsening redness, swelling, fever.  If this does not improve with the prescribed antibiotic, you likely will need different medicine medication and further testing.
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
## 2019-03-03 NOTE — ED Notes (Signed)
Right breast is red , warm to touch and tender.  She stated that it was draining in June but when she was put on antibiotic, the drainage got resolved.  No drainage noted during this assessment.
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
## 2019-06-29 ENCOUNTER — Encounter (HOSPITAL_BASED_OUTPATIENT_CLINIC_OR_DEPARTMENT_OTHER): Payer: Self-pay | Admitting: Emergency Medicine

## 2019-06-29 ENCOUNTER — Other Ambulatory Visit: Payer: Self-pay

## 2019-06-29 ENCOUNTER — Emergency Department (HOSPITAL_BASED_OUTPATIENT_CLINIC_OR_DEPARTMENT_OTHER)
Admission: EM | Admit: 2019-06-29 | Discharge: 2019-06-29 | Disposition: A | Discharge status: Home / Self Care | Payer: No Typology Code available for payment source | Attending: Emergency Medicine | Admitting: Emergency Medicine

## 2019-06-29 DIAGNOSIS — R519 Headache, unspecified: Secondary | ICD-10-CM | POA: Insufficient documentation

## 2019-06-29 HISTORY — DX: Other chronic pain: G89.29

## 2019-06-29 MED ORDER — DIPHENHYDRAMINE HCL 50 MG/ML IJ SOLN
25.0000 mg | Freq: Once | INTRAMUSCULAR | Status: AC
  Administered 2019-06-29: 25 mg via INTRAVENOUS
  Filled 2019-06-29: qty 1

## 2019-06-29 MED ORDER — SODIUM CHLORIDE 0.9 % IV BOLUS
1000.0000 mL | Freq: Once | INTRAVENOUS | Status: AC
  Administered 2019-06-29: 1000 mL via INTRAVENOUS

## 2019-06-29 MED ORDER — PROCHLORPERAZINE EDISYLATE 10 MG/2ML IJ SOLN
10.0000 mg | Freq: Once | INTRAMUSCULAR | Status: AC
  Administered 2019-06-29: 10 mg via INTRAVENOUS
  Filled 2019-06-29: qty 2

## 2019-06-29 MED ORDER — DEXAMETHASONE SODIUM PHOSPHATE 10 MG/ML IJ SOLN
10.0000 mg | Freq: Once | INTRAMUSCULAR | Status: AC
  Administered 2019-06-29: 10 mg via INTRAVENOUS
  Filled 2019-06-29: qty 1

## 2019-06-29 MED ORDER — KETOROLAC TROMETHAMINE 30 MG/ML IJ SOLN
30.0000 mg | Freq: Once | INTRAMUSCULAR | Status: AC
  Administered 2019-06-29: 30 mg via INTRAVENOUS
  Filled 2019-06-29: qty 1

## 2019-06-29 NOTE — ED Provider Notes (Signed)
[facility] [facility] EMERGENCY DEPARTMENT Provider Note   CSN: [id number] Arrival date & time: 06/29/19  1024     History Chief Complaint  Patient presents with  . Headache    Kylie Williams is a 24 y.o. female.  Kylie Williams is a 24 y.o. female with history of headaches, who presents to the ED for evaluation of left-sided headache that has been ongoing for the past 4 days.  Patient states it is a constant dull throbbing ache.  She reports some associated light sensitivity, states that she has had a few seconds of blurred vision when pain gets bad but denies any persistent vision changes, no diplopia.  She denies any associated nausea or vomiting, no facial asymmetry, numbness weakness or tingling.  She denies any fevers or chills, no neck stiffness or neck pain.  States that she has been taking over-the-counter Excedrin Migraine multiple times over the past 4 days without improvement.  She states a history of similar headaches that she gets about twice a month, she thinks she has migraines but has never been formally diagnosed, has not seen her PCP or neurology regarding these headaches because usually they resolve with Excedrin.  She states aside from headache lasting longer than usual it feels like her usual headache.  It was not sudden in onset.         Past Medical History:  Diagnosis Date  . Chronic headaches   . Genital herpes     There are no problems to display for this patient.   Past Surgical History:  Procedure Laterality Date  . CESAREAN SECTION       OB History    Gravida      Para      Term      Preterm      AB      Living  1     SAB      TAB      Ectopic      Multiple      Live Births  1           Family History  Problem Relation Age of Onset  . Diabetes Maternal Grandmother     Social History   Tobacco Use  . Smoking status: Never Smoker  . Smokeless tobacco: Never Used  Substance Use Topics  . Alcohol use: Yes  . Drug use:  Yes    Types: Marijuana    Home Medications Prior to Admission medications   Medication Sig Start Date End Date Taking? Authorizing Provider  ibuprofen (ADVIL) 400 MG tablet Take 400 mg by mouth every 6 (six) hours as needed for moderate pain.    [provider]    Allergies    Amoxicillin, Bactrim [sulfamethoxazole-trimethoprim], Penicillins, Zithromax [azithromycin], and Sulfa antibiotics  Review of Systems   Review of Systems  Constitutional: Negative for chills and fever.  HENT: Negative for congestion, rhinorrhea and sore throat.   Respiratory: Negative for cough and shortness of breath.   Cardiovascular: Negative for chest pain.  Gastrointestinal: Negative for nausea and vomiting.  Genitourinary: Negative for dysuria and frequency.  Musculoskeletal: Negative for neck pain and neck stiffness.  Skin: Negative for color change and rash.  Neurological: Positive for headaches. Negative for weakness and numbness.    Physical Exam Updated Vital Signs BP 126/80 (BP Location: Left Arm)   Pulse 80   Temp 98.5 F (36.9 C) (Oral)   Resp 16   Ht 5\' 2"  (1.575 m)   Wt  63.5 kg   LMP 06/23/2019   SpO2 100%   BMI 25.61 kg/m   Physical Exam Vitals and nursing note reviewed.  Constitutional:      General: She is not in acute distress.    Appearance: She is well-developed. She is not diaphoretic.  HENT:     Head: Normocephalic and atraumatic.  Eyes:     General:        Right eye: No discharge.        Left eye: No discharge.     Extraocular Movements: Extraocular movements intact.     Pupils: Pupils are equal, round, and reactive to light.  Neck:     Meningeal: Brudzinski's sign and Kernig's sign absent.     Comments: No nuchal rigidity Pulmonary:     Effort: Pulmonary effort is normal. No respiratory distress.  Musculoskeletal:     Cervical back: Neck supple. No rigidity.  Skin:    General: Skin is warm and dry.  Neurological:     Mental Status: She is alert.       GCS: GCS eye subscore is 4. GCS verbal subscore is 5. GCS motor subscore is 6.     Coordination: Coordination normal.     Comments: Speech is clear, able to follow commands CN III-XII intact Normal strength in upper and lower extremities bilaterally including dorsiflexion and plantar flexion, strong and equal grip strength Sensation normal to light and sharp touch Moves extremities without ataxia, coordination intact Normal finger to nose and rapid alternating movements No pronator drift  Psychiatric:        Mood and Affect: Mood normal.        Behavior: Behavior normal.     ED Results / Procedures / Treatments   Labs (all labs ordered are listed, but only abnormal results are displayed) Labs Reviewed - No data to display  EKG None  Radiology No results found.  Procedures Procedures (including critical care time)  Medications Ordered in ED Medications  sodium chloride 0.9 % bolus 1,000 mL (0 mLs Intravenous Stopped 06/29/19 1321)  ketorolac (TORADOL) 30 MG/ML injection 30 mg (30 mg Intravenous Given 06/29/19 1230)  prochlorperazine (COMPAZINE) injection 10 mg (10 mg Intravenous Given 06/29/19 1227)  diphenhydrAMINE (BENADRYL) injection 25 mg (25 mg Intravenous Given 06/29/19 1232)  dexamethasone (DECADRON) injection 10 mg (10 mg Intravenous Given 06/29/19 1225)    ED Course  I have reviewed the triage vital signs and the nursing notes.  Pertinent labs & imaging results that were available during my care of the patient were reviewed by me and considered in my medical decision making (see chart for details).    MDM Rules/Calculators/A&P                      Pt HA treated and resolved while in ED.  Presentation is like pts typical HA and non concerning for [facility], ICH, Meningitis, or temporal arteritis. Pt is afebrile with no focal neuro deficits, nuchal rigidity, or change in vision. Pt is to follow up with PCP to discuss prophylactic medication. Pt verbalizes understanding  and is agreeable with plan to dc.   Final Clinical Impression(s) / ED Diagnoses Final diagnoses:  Bad headache    Rx / DC Orders ED Discharge Orders    None       Legrand Rams 06/29/19 1425    Raeford Razor, MD 06/30/19 [phone]

## 2019-06-29 NOTE — Discharge Instructions (Signed)
I suspect you do have migraine headaches, I am reassured that years has improved today.  In the future you can use naproxen and/or Tylenol to treat headaches, make sure you are drinking plenty of water.  If you continue to have headaches like this I think you would benefit from following up with neurology they can help with medications to treat your headaches.  Return to the ED for any of the below symptoms.  Your migraine headache becomes severe. Your migraine headache lasts longer than 72 hours. You have a fever. You have a stiff neck. You have vision loss. Your muscles feel weak or like you cannot control them. You start to lose your balance often. You have trouble walking. You faint. You have a seizure.

## 2019-06-29 NOTE — ED Triage Notes (Signed)
Pt has frequent headaches.  This one lasting 4 days.  Took OTC Excedrin migraine but not working today.  Some blurry vision.  Sensitivity to light.  No N/V.

## 2023-07-07 ENCOUNTER — Encounter (HOSPITAL_BASED_OUTPATIENT_CLINIC_OR_DEPARTMENT_OTHER): Payer: Self-pay

## 2023-07-07 ENCOUNTER — Emergency Department (HOSPITAL_BASED_OUTPATIENT_CLINIC_OR_DEPARTMENT_OTHER)
Admission: EM | Admit: 2023-07-07 | Discharge: 2023-07-07 | Disposition: A | Discharge status: Home / Self Care | Payer: Medicaid Other | Attending: Emergency Medicine | Admitting: Emergency Medicine

## 2023-07-07 ENCOUNTER — Other Ambulatory Visit: Payer: Self-pay

## 2023-07-07 DIAGNOSIS — N6341 Unspecified lump in right breast, subareolar: Secondary | ICD-10-CM | POA: Diagnosis present

## 2023-07-07 MED ORDER — IBUPROFEN 800 MG PO TABS: 800.0000 mg | ORAL_TABLET | Freq: Three times a day (TID) | ORAL | 0 refills | Status: AC

## 2023-07-07 MED ORDER — CEPHALEXIN 500 MG PO CAPS: 500.0000 mg | ORAL_CAPSULE | Freq: Four times a day (QID) | ORAL | 0 refills | Status: AC

## 2023-07-07 NOTE — ED Provider Notes (Signed)
 [facility] DEPARTMENT [facility] Provider Note   CSN: [id number] Arrival date & time: 07/07/23  0932     History  Chief Complaint  Patient presents with   Breast Mass    Kylie Williams is a 28 y.o. female.  HPI Patient reports she developed a very tender swollen area around her right nipple that started 3 days ago.  She has not had any drainage.  She reports she did have an abscess after breast-feeding last August.  Patient is no longer breast-feeding or has had any injuries or trauma to the breast.  She reports last night her lymph nodes in her underarm felt tender but less so today.  Fevers no chills.  No nausea no vomiting.  Otherwise feels well.    Home Medications Prior to Admission medications   Medication Sig Start Date End Date Taking? Authorizing Provider  cephALEXin (KEFLEX) 500 MG capsule Take 1 capsule (500 mg total) by mouth 4 (four) times daily. 07/07/23  Yes Arby Barrette, MD  ibuprofen (ADVIL) 800 MG tablet Take 1 tablet (800 mg total) by mouth 3 (three) times daily. 07/07/23  Yes Arby Barrette, MD  ibuprofen (ADVIL) 400 MG tablet Take 400 mg by mouth every 6 (six) hours as needed for moderate pain.    [provider]      Allergies    Amoxicillin, Bactrim [sulfamethoxazole-trimethoprim], Penicillins, Zithromax [azithromycin], and Sulfa antibiotics    Review of Systems   Review of Systems  Physical Exam Updated Vital Signs BP 126/88 (BP Location: Right Arm)   Pulse 86   Temp 97.6 F (36.4 C)   Resp 18   Ht 5\' 2"  (1.575 m)   Wt 72.6 kg   LMP 07/04/2023   SpO2 96%   BMI 29.26 kg/m  Physical Exam Constitutional:      Comments: Alert nontoxic clinically well in appearance.  HENT:     Mouth/Throat:     Pharynx: Oropharynx is clear.  Pulmonary:     Effort: Pulmonary effort is normal.  Musculoskeletal:     Comments: Patient's right breast has a very hard nodular mass beneath the areola.  This is about the size of half  dollar in circumference and irregularly somewhat deeper but circumscribed and isolated will.  There is no fluctuance to this.  It is tender.  There is some erythema to the breast.  See attached images.  Does not appear to be significantly cellulitic.  Patient has some mild tenderness in the axillary lymph nodes but none that are significantly palpable.  Skin:    General: Skin is warm and dry.  Neurological:     General: No focal deficit present.     Mental Status: She is oriented to person, place, and time.  Psychiatric:        Mood and Affect: Mood normal.        ED Results / Procedures / Treatments   Labs (all labs ordered are listed, but only abnormal results are displayed) Labs Reviewed - No data to display  EKG None  Radiology No results found.  Procedures Procedures    Medications Ordered in ED Medications - No data to display  ED Course/ Medical Decision Making/ A&P                                 Medical Decision Making Risk Prescription drug management.   Patient presents as outlined.  On physical  examination area of tenderness is very hard and circumscribed with some irregularity to it.  Currently there is no fluctuance to suggest drainable abscess pocket.  Patient describes having had a previous abscess.  She is not breast-feeding at this time.  Plan will be to initiate Keflex.  Patient reports she does have amoxicillin and penicillin allergies but believes she took Keflex last time she had the similar circumstance.  There were no adverse effects.  We discussed continuing with warm compresses and close follow-up.  She is aware of the difference in feeling between abscess and induration.  I have discussed the fact I have concern for how firm and irregular this mass is and that she requires very expeditious follow-up with probable imaging either breast ultrasound and/or mammography.  Patient voices understanding.  She has instructions to return to the emergency  department if there is changing in terms of erythema tenderness etc. as well as establishing follow-up for further monitoring of this mass.        Final Clinical Impression(s) / ED Diagnoses Final diagnoses:  Subareolar mass of right breast    Rx / DC Orders ED Discharge Orders          Ordered    cephALEXin (KEFLEX) 500 MG capsule  4 times daily        07/07/23 1045    ibuprofen (ADVIL) 800 MG tablet  3 times daily        07/07/23 1045    Ambulatory referral to [facility]        07/07/23 1050              Arby Barrette, MD 07/07/23 1101

## 2023-07-07 NOTE — ED Triage Notes (Signed)
 Swelling around right nipple on Friday. States redness and heat. Had similar issue after breastfeeding that needed to be drained last August.

## 2023-07-07 NOTE — Discharge Instructions (Addendum)
 1.  You have a hard and tender mass under your nipple on the right.  This may be a developing abscess or breast tissue infection, however at this time I have concern for a possible breast tumor.  At this time the mass is very firm and does not feel like it has pus in it.  It is very important that this is carefully observed and evaluated. 2.  You have been given an antibiotic prescription to take for the next week.  Take this consistently.  Use warm compresses every few hours and try to elevate the breast.  You may take ibuprofen 800 mg for pain. 3.  You should have a recheck within the next 1 to 2 days to see if there is formation of an abscess. 4.  You need follow-up with a breast clinic and or gynecologist as soon as possible for monitoring of this.  You should likely have a specialized breast ultrasound and/or mammogram.  I have included some referral information however you can contact the breast specialist you saw previously, your gynecologist or get established with another gynecologist.  This should all be done as soon as possible.  If you cannot get your recheck established with a gynecologist or breast center, return to the emergency department for recheck.

## 2023-07-21 ENCOUNTER — Other Ambulatory Visit: Payer: Self-pay

## 2023-07-21 ENCOUNTER — Encounter (HOSPITAL_BASED_OUTPATIENT_CLINIC_OR_DEPARTMENT_OTHER): Payer: Self-pay

## 2023-07-21 ENCOUNTER — Emergency Department (HOSPITAL_BASED_OUTPATIENT_CLINIC_OR_DEPARTMENT_OTHER)
Admission: EM | Admit: 2023-07-21 | Discharge: 2023-07-21 | Disposition: A | Discharge status: Home / Self Care | Attending: Emergency Medicine | Admitting: Emergency Medicine

## 2023-07-21 DIAGNOSIS — H5789 Other specified disorders of eye and adnexa: Secondary | ICD-10-CM | POA: Diagnosis present

## 2023-07-21 DIAGNOSIS — J069 Acute upper respiratory infection, unspecified: Secondary | ICD-10-CM | POA: Insufficient documentation

## 2023-07-21 DIAGNOSIS — H1032 Unspecified acute conjunctivitis, left eye: Secondary | ICD-10-CM | POA: Insufficient documentation

## 2023-07-21 MED ORDER — CIPROFLOXACIN HCL 0.3 % OP SOLN: 1.0000 [drp] | OPHTHALMIC | 0 refills | Status: AC

## 2023-07-21 NOTE — ED Triage Notes (Signed)
 States left eye was draining this morning. Saturday woke up with crusting on eye. Also c/o sore throat.

## 2023-07-21 NOTE — ED Provider Notes (Signed)
 [facility] Provider Note   CSN: [id number] Arrival date & time: 07/21/23  6213     History  Chief Complaint  Patient presents with   Eye Drainage   Sore Throat    Kylie Williams is a 28 y.o. female.  28 yo F with a chief complaints of sore throat and left eye itching and drainage.  The eye started bothering her a few days ago.  She tried Benadryl and thought the things got worse.  Now feels like it is much more itchy and it was matted this morning with drainage and she had trouble opening it.  She also has been having a bit of a sore throat.  Worse first thing in the morning.  She does wear contact lenses.   Sore Throat       Home Medications Prior to Admission medications   Medication Sig Start Date End Date Taking? Authorizing Provider  ciprofloxacin (CILOXAN) 0.3 % ophthalmic solution Place 1 drop into both eyes every 2 (two) hours. Administer 1 drop, every 2 hours, while awake, for 2 days. Then 1 drop, every 4 hours, while awake, for the next 5 days. 07/21/23  Yes Melene Plan, DO  cephALEXin (KEFLEX) 500 MG capsule Take 1 capsule (500 mg total) by mouth 4 (four) times daily. 07/07/23   Arby Barrette, MD  ibuprofen (ADVIL) 400 MG tablet Take 400 mg by mouth every 6 (six) hours as needed for moderate pain.    [provider]  ibuprofen (ADVIL) 800 MG tablet Take 1 tablet (800 mg total) by mouth 3 (three) times daily. 07/07/23   Arby Barrette, MD      Allergies    Amoxicillin, Bactrim [sulfamethoxazole-trimethoprim], Penicillins, Zithromax [azithromycin], and Sulfa antibiotics    Review of Systems   Review of Systems  Physical Exam Updated Vital Signs BP 122/67 (BP Location: Left Arm)   Pulse 75   Temp 98.1 F (36.7 C) (Oral)   Resp 16   Ht 5\' 2"  (1.575 m)   Wt 72.6 kg   LMP 07/04/2023   SpO2 100%   BMI 29.26 kg/m  Physical Exam Vitals and nursing note reviewed.  Constitutional:      General: She is not  in acute distress.    Appearance: She is well-developed. She is not diaphoretic.  HENT:     Head: Normocephalic and atraumatic.     Comments: Swollen turbinates posterior nasal drip    Right Ear: Tympanic membrane is not erythematous.  Eyes:     Pupils: Pupils are equal, round, and reactive to light.     Comments: Left eye conjunctival injection with purulent appearing drainage.  Extraocular motion intact.  Pupil equal round and reactive to light and accommodation.  Cardiovascular:     Rate and Rhythm: Normal rate and regular rhythm.     Heart sounds: No murmur heard.    No friction rub. No gallop.  Pulmonary:     Effort: Pulmonary effort is normal.     Breath sounds: No wheezing or rales.  Abdominal:     General: There is no distension.     Palpations: Abdomen is soft.     Tenderness: There is no abdominal tenderness.  Musculoskeletal:        General: No tenderness.     Cervical back: Normal range of motion and neck supple.  Skin:    General: Skin is warm and dry.  Neurological:     Mental Status: She is alert  and oriented to person, place, and time.  Psychiatric:        Behavior: Behavior normal.     ED Results / Procedures / Treatments   Labs (all labs ordered are listed, but only abnormal results are displayed) Labs Reviewed - No data to display  EKG None  Radiology No results found.  Procedures Procedures    Medications Ordered in ED Medications - No data to display  ED Course/ Medical Decision Making/ A&P                                 Medical Decision Making Risk Prescription drug management.   24 y oF with a chief complaints of left eye itching and drainage and sore throat.  Clinically the patient has bacterial conjunctivitis.  With contact lens use will start on ciprofloxacin as she has a sulfa allergy.  Sore throat appears more consistent with viral URI.  Will treat supportively.  PCP follow-up.  9:05 AM:  I have discussed the  diagnosis/risks/treatment options with the patient.  Evaluation and diagnostic testing in the emergency department does not suggest an emergent condition requiring admission or immediate intervention beyond what has been performed at this time.  They will follow up with PCP. We also discussed returning to the ED immediately if new or worsening sx occur. We discussed the sx which are most concerning (e.g., sudden worsening pain, fever, inability to tolerate by mouth) that necessitate immediate return. Medications administered to the patient during their visit and any new prescriptions provided to the patient are listed below.  Medications given during this visit Medications - No data to display   The patient appears reasonably screen and/or stabilized for discharge and I doubt any other medical condition or other [facility] requiring further screening, evaluation, or treatment in the ED at this time prior to discharge.          Final Clinical Impression(s) / ED Diagnoses Final diagnoses:  Acute bacterial conjunctivitis of left eye  Upper respiratory tract infection, unspecified type    Rx / DC Orders ED Discharge Orders          Ordered    ciprofloxacin (CILOXAN) 0.3 % ophthalmic solution  Every 2 hours        07/21/23 0900              Melene Plan, DO 07/21/23 [id number]

## 2023-07-21 NOTE — Discharge Instructions (Signed)
 Follow-up with your eye doctor in the office.  Try not to rub your eyes.  Take tylenol 2 pills 4 times a day and motrin 4 pills 3 times a day.  Drink plenty of fluids.  Return for worsening shortness of breath, headache, confusion. Follow up with your family doctor.
